# Patient Record
Sex: Female | Born: 1980 | Race: White | Hispanic: No | Marital: Married | Smoking: Never smoker
Health system: Southern US, Community
[De-identification: ages and names within clinical notes are randomized; demographics above are authoritative.]

## PROBLEM LIST (undated history)

## (undated) DIAGNOSIS — R51 Headache: Secondary | ICD-10-CM

## (undated) DIAGNOSIS — R55 Syncope and collapse: Secondary | ICD-10-CM

## (undated) DIAGNOSIS — R519 Headache, unspecified: Secondary | ICD-10-CM

## (undated) DIAGNOSIS — F4310 Post-traumatic stress disorder, unspecified: Secondary | ICD-10-CM

## (undated) DIAGNOSIS — R569 Unspecified convulsions: Secondary | ICD-10-CM

## (undated) DIAGNOSIS — W860XXA Exposure to domestic wiring and appliances, initial encounter: Secondary | ICD-10-CM

## (undated) HISTORY — PX: TUBAL LIGATION: SHX77

## (undated) HISTORY — PX: RIGHT OOPHORECTOMY: SHX2359

## (undated) HISTORY — PX: HAND SURGERY: SHX662

## (undated) HISTORY — PX: TONSILLECTOMY: SUR1361

## (undated) HISTORY — PX: APPENDECTOMY: SHX54

## (undated) HISTORY — PX: TOE SURGERY: SHX1073

## (undated) HISTORY — PX: CHOLECYSTECTOMY: SHX55

## (undated) HISTORY — PX: LAPAROSCOPIC OVARIAN: SHX5906

---

## 2014-07-11 DIAGNOSIS — W860XXA Exposure to domestic wiring and appliances, initial encounter: Secondary | ICD-10-CM

## 2014-07-11 HISTORY — DX: Exposure to domestic wiring and appliances, initial encounter: W86.0XXA

## 2016-07-16 ENCOUNTER — Emergency Department (HOSPITAL_COMMUNITY): Payer: Medicaid Other

## 2016-07-16 ENCOUNTER — Emergency Department (HOSPITAL_COMMUNITY)
Admission: EM | Admit: 2016-07-16 | Discharge: 2016-07-16 | Disposition: A | Discharge status: Home / Self Care | Payer: Medicaid Other | Attending: Emergency Medicine | Admitting: Emergency Medicine

## 2016-07-16 ENCOUNTER — Encounter (HOSPITAL_COMMUNITY): Payer: Self-pay | Admitting: *Deleted

## 2016-07-16 DIAGNOSIS — Y939 Activity, unspecified: Secondary | ICD-10-CM | POA: Diagnosis not present

## 2016-07-16 DIAGNOSIS — Y92512 Supermarket, store or market as the place of occurrence of the external cause: Secondary | ICD-10-CM | POA: Diagnosis not present

## 2016-07-16 DIAGNOSIS — Y999 Unspecified external cause status: Secondary | ICD-10-CM | POA: Insufficient documentation

## 2016-07-16 DIAGNOSIS — R55 Syncope and collapse: Secondary | ICD-10-CM | POA: Diagnosis present

## 2016-07-16 DIAGNOSIS — D649 Anemia, unspecified: Secondary | ICD-10-CM | POA: Diagnosis not present

## 2016-07-16 DIAGNOSIS — W1830XA Fall on same level, unspecified, initial encounter: Secondary | ICD-10-CM | POA: Insufficient documentation

## 2016-07-16 HISTORY — DX: Exposure to domestic wiring and appliances, initial encounter: W86.0XXA

## 2016-07-16 LAB — CBC WITH DIFFERENTIAL/PLATELET
BASOS ABS: 0 10*3/uL (ref 0.0–0.1)
BASOS PCT: 1 %
EOS ABS: 0.2 10*3/uL (ref 0.0–0.7)
Eosinophils Relative: 3 %
HEMATOCRIT: 32.2 % — AB (ref 36.0–46.0)
HEMOGLOBIN: 10.7 g/dL — AB (ref 12.0–15.0)
Lymphocytes Relative: 32 %
Lymphs Abs: 1.9 10*3/uL (ref 0.7–4.0)
MCH: 28.5 pg (ref 26.0–34.0)
MCHC: 33.2 g/dL (ref 30.0–36.0)
MCV: 85.6 fL (ref 78.0–100.0)
Monocytes Absolute: 0.3 10*3/uL (ref 0.1–1.0)
Monocytes Relative: 6 %
NEUTROS ABS: 3.5 10*3/uL (ref 1.7–7.7)
NEUTROS PCT: 58 %
Platelets: 219 10*3/uL (ref 150–400)
RBC: 3.76 MIL/uL — AB (ref 3.87–5.11)
RDW: 13 % (ref 11.5–15.5)
WBC: 5.9 10*3/uL (ref 4.0–10.5)

## 2016-07-16 LAB — I-STAT CHEM 8, ED
BUN: 18 mg/dL (ref 6–20)
Calcium, Ion: 1.21 mmol/L (ref 1.15–1.40)
Chloride: 103 mmol/L (ref 101–111)
Creatinine, Ser: 1.2 mg/dL — ABNORMAL HIGH (ref 0.44–1.00)
Glucose, Bld: 80 mg/dL (ref 65–99)
HEMATOCRIT: 30 % — AB (ref 36.0–46.0)
Hemoglobin: 10.2 g/dL — ABNORMAL LOW (ref 12.0–15.0)
POTASSIUM: 4 mmol/L (ref 3.5–5.1)
SODIUM: 140 mmol/L (ref 135–145)
TCO2: 25 mmol/L (ref 0–100)

## 2016-07-16 LAB — I-STAT BETA HCG BLOOD, ED (MC, WL, AP ONLY): I-stat hCG, quantitative: <5 m[IU]/mL (ref <5)

## 2016-07-16 LAB — CBG MONITORING, ED: GLUCOSE-CAPILLARY: 112 mg/dL — AB (ref 65–99)

## 2016-07-16 MED ORDER — SODIUM CHLORIDE 0.9 % IV SOLN
INTRAVENOUS | Status: DC
  Administered 2016-07-16: 17:00:00 via INTRAVENOUS

## 2016-07-16 MED ORDER — ACETAMINOPHEN 325 MG PO TABS
650.0000 mg | ORAL_TABLET | Freq: Once | ORAL | Status: AC
  Administered 2016-07-16: 650 mg via ORAL
  Filled 2016-07-16: qty 2

## 2016-07-16 MED ORDER — SODIUM CHLORIDE 0.9 % IV BOLUS (SEPSIS)
1000.0000 mL | Freq: Once | INTRAVENOUS | Status: AC
  Administered 2016-07-16: 1000 mL via INTRAVENOUS

## 2016-07-16 MED ORDER — KETOROLAC TROMETHAMINE 30 MG/ML IJ SOLN
15.0000 mg | Freq: Once | INTRAMUSCULAR | Status: AC
  Administered 2016-07-16: 15 mg via INTRAVENOUS
  Filled 2016-07-16: qty 1

## 2016-07-16 MED ORDER — PROCHLORPERAZINE EDISYLATE 5 MG/ML IJ SOLN
10.0000 mg | Freq: Once | INTRAMUSCULAR | Status: AC
  Administered 2016-07-16: 10 mg via INTRAVENOUS
  Filled 2016-07-16: qty 2
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
## 2016-07-16 NOTE — ED Notes (Signed)
Patient transported to CT 

## 2016-07-16 NOTE — Discharge Instructions (Signed)
Follow up with a primary care doctor for further evaluation of the syncopal episodes you have had and your anemia consider holter monitor testing, consider EEG testing with a primary care doctor

## 2016-08-11 ENCOUNTER — Emergency Department (HOSPITAL_BASED_OUTPATIENT_CLINIC_OR_DEPARTMENT_OTHER)
Admission: EM | Admit: 2016-08-11 | Discharge: 2016-08-11 | Disposition: A | Discharge status: Home / Self Care | Payer: Medicaid Other | Attending: Emergency Medicine | Admitting: Emergency Medicine

## 2016-08-11 ENCOUNTER — Encounter (HOSPITAL_BASED_OUTPATIENT_CLINIC_OR_DEPARTMENT_OTHER): Payer: Self-pay

## 2016-08-11 DIAGNOSIS — R072 Precordial pain: Secondary | ICD-10-CM | POA: Insufficient documentation

## 2016-08-11 DIAGNOSIS — R0789 Other chest pain: Secondary | ICD-10-CM

## 2016-08-11 DIAGNOSIS — R55 Syncope and collapse: Secondary | ICD-10-CM | POA: Diagnosis present

## 2016-08-11 MED ORDER — NAPROXEN 375 MG PO TABS: 375.0000 mg | ORAL_TABLET | Freq: Two times a day (BID) | ORAL | 0 refills | Status: AC

## 2016-08-11 MED FILL — NAPROXEN 375 MG TABLET: 375 | 10 days supply | Qty: 20 | Fill #0
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
## 2016-08-13 ENCOUNTER — Other Ambulatory Visit: Payer: Self-pay

## 2016-08-13 ENCOUNTER — Encounter (HOSPITAL_COMMUNITY): Payer: Self-pay | Admitting: Emergency Medicine

## 2016-08-13 ENCOUNTER — Emergency Department (HOSPITAL_COMMUNITY)
Admission: EM | Admit: 2016-08-13 | Discharge: 2016-08-13 | Disposition: A | Discharge status: Home / Self Care | Payer: Medicaid Other | Attending: Emergency Medicine | Admitting: Emergency Medicine

## 2016-08-13 DIAGNOSIS — Y929 Unspecified place or not applicable: Secondary | ICD-10-CM | POA: Insufficient documentation

## 2016-08-13 DIAGNOSIS — Y939 Activity, unspecified: Secondary | ICD-10-CM | POA: Diagnosis not present

## 2016-08-13 DIAGNOSIS — T754XXA Electrocution, initial encounter: Secondary | ICD-10-CM | POA: Diagnosis not present

## 2016-08-13 DIAGNOSIS — S0081XA Abrasion of other part of head, initial encounter: Secondary | ICD-10-CM | POA: Diagnosis not present

## 2016-08-13 DIAGNOSIS — Y999 Unspecified external cause status: Secondary | ICD-10-CM | POA: Insufficient documentation

## 2016-08-13 DIAGNOSIS — W868XXA Exposure to other electric current, initial encounter: Secondary | ICD-10-CM | POA: Diagnosis not present

## 2016-08-13 DIAGNOSIS — Z79899 Other long term (current) drug therapy: Secondary | ICD-10-CM | POA: Insufficient documentation

## 2016-08-13 DIAGNOSIS — R112 Nausea with vomiting, unspecified: Secondary | ICD-10-CM

## 2016-08-13 LAB — BASIC METABOLIC PANEL
Anion gap: 9 (ref 5–15)
BUN: 10 mg/dL (ref 6–20)
CHLORIDE: 103 mmol/L (ref 101–111)
CO2: 25 mmol/L (ref 22–32)
CREATININE: 0.94 mg/dL (ref 0.44–1.00)
Calcium: 9.6 mg/dL (ref 8.9–10.3)
GFR calc non Af Amer: >60 mL/min (ref >60)
Glucose, Bld: 102 mg/dL — ABNORMAL HIGH (ref 65–99)
POTASSIUM: 3.9 mmol/L (ref 3.5–5.1)
Sodium: 137 mmol/L (ref 135–145)

## 2016-08-13 LAB — CK: Total CK: 82 U/L (ref 38–234)

## 2016-08-13 MED ORDER — LORAZEPAM 1 MG PO TABS
1.0000 mg | ORAL_TABLET | Freq: Once | ORAL | Status: DC
  Filled 2016-08-13: qty 1

## 2016-08-13 MED ORDER — METOCLOPRAMIDE HCL 5 MG/ML IJ SOLN
10.0000 mg | Freq: Once | INTRAMUSCULAR | Status: AC
  Administered 2016-08-13: 10 mg via INTRAVENOUS
  Filled 2016-08-13: qty 2

## 2016-08-13 MED ORDER — LORAZEPAM 2 MG/ML IJ SOLN
1.0000 mg | Freq: Once | INTRAMUSCULAR | Status: AC
  Administered 2016-08-13: 1 mg via INTRAVENOUS
  Filled 2016-08-13: qty 1

## 2016-08-13 NOTE — ED Notes (Signed)
Pt vomiting, MD aware. Rn to hold off on given PO Ativan until pt not vomiting.
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
## 2016-08-13 NOTE — ED Notes (Signed)
Pt stating she wants to be transferred to [facility]. Dr Ashok Cordia aware.
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
## 2016-08-13 NOTE — Discharge Instructions (Addendum)
It was our pleasure to provide your ER care today - we hope that you feel better.  Your lab tests look good/normal.   Rest. Drink plenty of fluids.  Take tylenol or motrin as need.   Follow up with primary care doctor Monday.  Return to ER if worse, new symptoms, persistent vomiting, other concern.   You were given medication in the ER that causes drowsiness - no driving for the next 6 hours.

## 2016-10-04 ENCOUNTER — Emergency Department (HOSPITAL_BASED_OUTPATIENT_CLINIC_OR_DEPARTMENT_OTHER): Payer: Commercial Managed Care - PPO

## 2016-10-04 ENCOUNTER — Encounter (HOSPITAL_BASED_OUTPATIENT_CLINIC_OR_DEPARTMENT_OTHER): Payer: Self-pay

## 2016-10-04 ENCOUNTER — Emergency Department (HOSPITAL_BASED_OUTPATIENT_CLINIC_OR_DEPARTMENT_OTHER)
Admission: EM | Admit: 2016-10-04 | Discharge: 2016-10-04 | Disposition: A | Discharge status: Home / Self Care | Payer: Commercial Managed Care - PPO | Attending: Physician Assistant | Admitting: Physician Assistant

## 2016-10-04 DIAGNOSIS — R112 Nausea with vomiting, unspecified: Secondary | ICD-10-CM | POA: Insufficient documentation

## 2016-10-04 DIAGNOSIS — R109 Unspecified abdominal pain: Secondary | ICD-10-CM | POA: Diagnosis not present

## 2016-10-04 DIAGNOSIS — G4700 Insomnia, unspecified: Secondary | ICD-10-CM

## 2016-10-04 DIAGNOSIS — Z79899 Other long term (current) drug therapy: Secondary | ICD-10-CM | POA: Diagnosis not present

## 2016-10-04 DIAGNOSIS — R51 Headache: Secondary | ICD-10-CM | POA: Diagnosis present

## 2016-10-04 HISTORY — DX: Syncope and collapse: R55

## 2016-10-04 HISTORY — DX: Unspecified convulsions: R56.9

## 2016-10-04 HISTORY — DX: Post-traumatic stress disorder, unspecified: F43.10

## 2016-10-04 HISTORY — DX: Headache: R51

## 2016-10-04 HISTORY — DX: Headache, unspecified: R51.9

## 2016-10-04 LAB — COMPREHENSIVE METABOLIC PANEL
ALK PHOS: 70 U/L (ref 38–126)
ALT: 14 U/L (ref 14–54)
ANION GAP: 8 (ref 5–15)
AST: 17 U/L (ref 15–41)
Albumin: 4.2 g/dL (ref 3.5–5.0)
BUN: 12 mg/dL (ref 6–20)
CALCIUM: 9.3 mg/dL (ref 8.9–10.3)
CO2: 24 mmol/L (ref 22–32)
Chloride: 108 mmol/L (ref 101–111)
Creatinine, Ser: 1 mg/dL (ref 0.44–1.00)
GFR calc non Af Amer: >60 mL/min (ref >60)
Glucose, Bld: 99 mg/dL (ref 65–99)
Potassium: 4 mmol/L (ref 3.5–5.1)
SODIUM: 140 mmol/L (ref 135–145)
TOTAL PROTEIN: 7.7 g/dL (ref 6.5–8.1)
Total Bilirubin: 0.5 mg/dL (ref 0.3–1.2)

## 2016-10-04 LAB — URINALYSIS, ROUTINE W REFLEX MICROSCOPIC
BILIRUBIN URINE: NEGATIVE
GLUCOSE, UA: NEGATIVE mg/dL
HGB URINE DIPSTICK: NEGATIVE
Ketones, ur: NEGATIVE mg/dL
Leukocytes, UA: NEGATIVE
Nitrite: NEGATIVE
PH: 5.5 (ref 5.0–8.0)
Protein, ur: NEGATIVE mg/dL
SPECIFIC GRAVITY, URINE: 1.024 (ref 1.005–1.030)

## 2016-10-04 LAB — ETHANOL: Alcohol, Ethyl (B): <5 mg/dL (ref <5)

## 2016-10-04 LAB — CBC WITH DIFFERENTIAL/PLATELET
Basophils Absolute: 0 10*3/uL (ref 0.0–0.1)
Basophils Relative: 0 %
EOS ABS: 0.1 10*3/uL (ref 0.0–0.7)
EOS PCT: 1 %
HCT: 36 % (ref 36.0–46.0)
HEMOGLOBIN: 12.1 g/dL (ref 12.0–15.0)
LYMPHS ABS: 1.9 10*3/uL (ref 0.7–4.0)
Lymphocytes Relative: 26 %
MCH: 27.6 pg (ref 26.0–34.0)
MCHC: 33.6 g/dL (ref 30.0–36.0)
MCV: 82.2 fL (ref 78.0–100.0)
MONO ABS: 0.5 10*3/uL (ref 0.1–1.0)
Monocytes Relative: 7 %
NEUTROS PCT: 66 %
Neutro Abs: 4.9 10*3/uL (ref 1.7–7.7)
Platelets: 258 10*3/uL (ref 150–400)
RBC: 4.38 MIL/uL (ref 3.87–5.11)
RDW: 13.8 % (ref 11.5–15.5)
WBC: 7.3 10*3/uL (ref 4.0–10.5)

## 2016-10-04 LAB — ACETAMINOPHEN LEVEL: Acetaminophen (Tylenol), Serum: <10 ug/mL — ABNORMAL LOW (ref 10–30)

## 2016-10-04 LAB — RAPID URINE DRUG SCREEN, HOSP PERFORMED
Amphetamines: NOT DETECTED
Barbiturates: POSITIVE — AB
Benzodiazepines: NOT DETECTED
COCAINE: NOT DETECTED
OPIATES: NOT DETECTED
TETRAHYDROCANNABINOL: NOT DETECTED

## 2016-10-04 LAB — SALICYLATE LEVEL: Salicylate Lvl: <7 mg/dL (ref 2.8–30.0)

## 2016-10-04 LAB — PREGNANCY, URINE: Preg Test, Ur: NEGATIVE

## 2016-10-04 MED ORDER — DIPHENHYDRAMINE HCL 50 MG/ML IJ SOLN
25.0000 mg | Freq: Once | INTRAMUSCULAR | Status: AC
  Administered 2016-10-04: 25 mg via INTRAVENOUS
  Filled 2016-10-04: qty 1

## 2016-10-04 MED ORDER — METOCLOPRAMIDE HCL 5 MG/ML IJ SOLN
10.0000 mg | Freq: Once | INTRAMUSCULAR | Status: AC
  Administered 2016-10-04: 10 mg via INTRAVENOUS
  Filled 2016-10-04: qty 2

## 2016-10-04 MED ORDER — KETOROLAC TROMETHAMINE 15 MG/ML IJ SOLN
30.0000 mg | Freq: Once | INTRAMUSCULAR | Status: AC
  Administered 2016-10-04: 30 mg via INTRAVENOUS
  Filled 2016-10-04: qty 2

## 2016-10-04 MED ORDER — SODIUM CHLORIDE 0.9 % IV BOLUS (SEPSIS)
1000.0000 mL | Freq: Once | INTRAVENOUS | Status: AC
  Administered 2016-10-04: 1000 mL via INTRAVENOUS
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
## 2016-11-02 ENCOUNTER — Encounter (HOSPITAL_BASED_OUTPATIENT_CLINIC_OR_DEPARTMENT_OTHER): Payer: Self-pay | Admitting: Emergency Medicine

## 2016-11-02 ENCOUNTER — Emergency Department (HOSPITAL_BASED_OUTPATIENT_CLINIC_OR_DEPARTMENT_OTHER)
Admission: EM | Admit: 2016-11-02 | Discharge: 2016-11-02 | Disposition: A | Discharge status: Home / Self Care | Payer: Commercial Managed Care - PPO | Attending: Emergency Medicine | Admitting: Emergency Medicine

## 2016-11-02 ENCOUNTER — Emergency Department (HOSPITAL_BASED_OUTPATIENT_CLINIC_OR_DEPARTMENT_OTHER): Payer: Commercial Managed Care - PPO

## 2016-11-02 DIAGNOSIS — O209 Hemorrhage in early pregnancy, unspecified: Secondary | ICD-10-CM

## 2016-11-02 DIAGNOSIS — D259 Leiomyoma of uterus, unspecified: Secondary | ICD-10-CM

## 2016-11-02 DIAGNOSIS — N939 Abnormal uterine and vaginal bleeding, unspecified: Secondary | ICD-10-CM

## 2016-11-02 DIAGNOSIS — Z79899 Other long term (current) drug therapy: Secondary | ICD-10-CM | POA: Insufficient documentation

## 2016-11-02 DIAGNOSIS — Z3A10 10 weeks gestation of pregnancy: Secondary | ICD-10-CM | POA: Diagnosis not present

## 2016-11-02 DIAGNOSIS — O3411 Maternal care for benign tumor of corpus uteri, first trimester: Secondary | ICD-10-CM | POA: Insufficient documentation

## 2016-11-02 DIAGNOSIS — N9489 Other specified conditions associated with female genital organs and menstrual cycle: Secondary | ICD-10-CM | POA: Diagnosis not present

## 2016-11-02 LAB — CBC WITH DIFFERENTIAL/PLATELET
BASOS ABS: 0 10*3/uL (ref 0.0–0.1)
Basophils Relative: 1 %
Eosinophils Absolute: 0.1 10*3/uL (ref 0.0–0.7)
Eosinophils Relative: 1 %
HEMATOCRIT: 32.3 % — AB (ref 36.0–46.0)
Hemoglobin: 10.6 g/dL — ABNORMAL LOW (ref 12.0–15.0)
LYMPHS PCT: 26 %
Lymphs Abs: 1.1 10*3/uL (ref 0.7–4.0)
MCH: 27.8 pg (ref 26.0–34.0)
MCHC: 32.8 g/dL (ref 30.0–36.0)
MCV: 84.8 fL (ref 78.0–100.0)
MONO ABS: 0.2 10*3/uL (ref 0.1–1.0)
MONOS PCT: 5 %
NEUTROS ABS: 2.8 10*3/uL (ref 1.7–7.7)
Neutrophils Relative %: 67 %
Platelets: 187 10*3/uL (ref 150–400)
RBC: 3.81 MIL/uL — ABNORMAL LOW (ref 3.87–5.11)
RDW: 13.5 % (ref 11.5–15.5)
WBC: 4.2 10*3/uL (ref 4.0–10.5)

## 2016-11-02 LAB — WET PREP, GENITAL
Sperm: NONE SEEN
Trich, Wet Prep: NONE SEEN
Yeast Wet Prep HPF POC: NONE SEEN

## 2016-11-02 LAB — URINALYSIS, MICROSCOPIC (REFLEX)

## 2016-11-02 LAB — URINALYSIS, ROUTINE W REFLEX MICROSCOPIC
BILIRUBIN URINE: NEGATIVE
GLUCOSE, UA: NEGATIVE mg/dL
KETONES UR: NEGATIVE mg/dL
Leukocytes, UA: NEGATIVE
Nitrite: NEGATIVE
PH: 8.5 — AB (ref 5.0–8.0)
Protein, ur: NEGATIVE mg/dL
Specific Gravity, Urine: 1.018 (ref 1.005–1.030)

## 2016-11-02 LAB — PREGNANCY, URINE: Preg Test, Ur: POSITIVE — AB

## 2016-11-02 LAB — HCG, QUANTITATIVE, PREGNANCY: hCG, Beta Chain, Quant, S: <1 m[IU]/mL (ref <5)

## 2016-11-02 LAB — ABO/RH: ABO/RH(D): O POS

## 2016-11-02 MED ORDER — ACETAMINOPHEN 325 MG PO TABS
650.0000 mg | ORAL_TABLET | Freq: Once | ORAL | Status: AC
  Administered 2016-11-02: 650 mg via ORAL
  Filled 2016-11-02: qty 2

## 2016-11-02 NOTE — ED Triage Notes (Signed)
Vaginal bleeding started today, took home preg test several months ago and came back positive

## 2016-11-02 NOTE — ED Notes (Signed)
ED Provider at bedside. 

## 2016-11-02 NOTE — Discharge Instructions (Signed)
Your blood work and ultrasound confirmed no active pregnancy. It is possible that your urine tests were a lab error. Please follow up with your OB/GYN if continue to have bleeding or any other concerns
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
## 2016-11-03 LAB — GC/CHLAMYDIA PROBE AMP (~~LOC~~) NOT AT ARMC
Chlamydia: NEGATIVE
NEISSERIA GONORRHEA: NEGATIVE

## 2016-12-03 ENCOUNTER — Ambulatory Visit: Payer: Self-pay | Admitting: Physician Assistant

## 2017-02-11 ENCOUNTER — Encounter (HOSPITAL_COMMUNITY): Payer: Self-pay

## 2017-02-11 ENCOUNTER — Emergency Department (HOSPITAL_COMMUNITY): Payer: Commercial Managed Care - PPO

## 2017-02-11 ENCOUNTER — Emergency Department (HOSPITAL_COMMUNITY)
Admission: EM | Admit: 2017-02-11 | Discharge: 2017-02-12 | Disposition: A | Discharge status: Home / Self Care | Payer: Commercial Managed Care - PPO | Attending: Emergency Medicine | Admitting: Emergency Medicine

## 2017-02-11 DIAGNOSIS — R569 Unspecified convulsions: Secondary | ICD-10-CM

## 2017-02-11 DIAGNOSIS — N760 Acute vaginitis: Secondary | ICD-10-CM | POA: Insufficient documentation

## 2017-02-11 DIAGNOSIS — Z79899 Other long term (current) drug therapy: Secondary | ICD-10-CM | POA: Insufficient documentation

## 2017-02-11 DIAGNOSIS — B9689 Other specified bacterial agents as the cause of diseases classified elsewhere: Secondary | ICD-10-CM

## 2017-02-11 LAB — HEPATIC FUNCTION PANEL
ALBUMIN: 3.7 g/dL (ref 3.5–5.0)
ALT: 15 U/L (ref 14–54)
AST: 17 U/L (ref 15–41)
Alkaline Phosphatase: 66 U/L (ref 38–126)
BILIRUBIN TOTAL: 0.3 mg/dL (ref 0.3–1.2)
Bilirubin, Direct: <0.1 mg/dL — ABNORMAL LOW (ref 0.1–0.5)
TOTAL PROTEIN: 6.5 g/dL (ref 6.5–8.1)

## 2017-02-11 LAB — CBG MONITORING, ED: GLUCOSE-CAPILLARY: 101 mg/dL — AB (ref 65–99)

## 2017-02-11 LAB — BASIC METABOLIC PANEL
ANION GAP: 7 (ref 5–15)
BUN: 13 mg/dL (ref 6–20)
CALCIUM: 8.5 mg/dL — AB (ref 8.9–10.3)
CO2: 21 mmol/L — ABNORMAL LOW (ref 22–32)
CREATININE: 0.91 mg/dL (ref 0.44–1.00)
Chloride: 111 mmol/L (ref 101–111)
GFR calc Af Amer: >60 mL/min (ref >60)
GLUCOSE: 98 mg/dL (ref 65–99)
Potassium: 3.3 mmol/L — ABNORMAL LOW (ref 3.5–5.1)
Sodium: 139 mmol/L (ref 135–145)

## 2017-02-11 LAB — CBC
HCT: 29.2 % — ABNORMAL LOW (ref 36.0–46.0)
Hemoglobin: 9.8 g/dL — ABNORMAL LOW (ref 12.0–15.0)
MCH: 25.7 pg — AB (ref 26.0–34.0)
MCHC: 33.6 g/dL (ref 30.0–36.0)
MCV: 76.4 fL — AB (ref 78.0–100.0)
PLATELETS: 253 10*3/uL (ref 150–400)
RBC: 3.82 MIL/uL — ABNORMAL LOW (ref 3.87–5.11)
RDW: 16.6 % — AB (ref 11.5–15.5)
WBC: 5.5 10*3/uL (ref 4.0–10.5)

## 2017-02-11 LAB — PROTIME-INR
INR: 1.02
Prothrombin Time: 13.4 seconds (ref 11.4–15.2)

## 2017-02-11 LAB — I-STAT BETA HCG BLOOD, ED (MC, WL, AP ONLY): I-stat hCG, quantitative: <5 m[IU]/mL (ref <5)

## 2017-02-11 LAB — I-STAT TROPONIN, ED: TROPONIN I, POC: 0 ng/mL (ref 0.00–0.08)

## 2017-02-11 LAB — WET PREP, GENITAL
SPERM: NONE SEEN
Trich, Wet Prep: NONE SEEN
Yeast Wet Prep HPF POC: NONE SEEN

## 2017-02-11 MED ORDER — DIPHENHYDRAMINE HCL 50 MG/ML IJ SOLN
25.0000 mg | Freq: Once | INTRAMUSCULAR | Status: AC
  Administered 2017-02-11: 20:00:00 via INTRAVENOUS
  Filled 2017-02-11: qty 1

## 2017-02-11 MED ORDER — SODIUM CHLORIDE 0.9 % IV BOLUS (SEPSIS)
1000.0000 mL | Freq: Once | INTRAVENOUS | Status: AC
  Administered 2017-02-11: 1000 mL via INTRAVENOUS

## 2017-02-11 MED ORDER — PROCHLORPERAZINE EDISYLATE 5 MG/ML IJ SOLN
10.0000 mg | Freq: Once | INTRAMUSCULAR | Status: AC
  Administered 2017-02-11: 10 mg via INTRAVENOUS
  Filled 2017-02-11: qty 2

## 2017-02-11 NOTE — ED Notes (Signed)
No respiratory or acute distress noted alert and oriented x 3 seizure precautions in effect c/o headache voiced no seizure activity noted.

## 2017-02-11 NOTE — ED Notes (Signed)
No respiratory or acute distress noted no seizure activity noted seizure precautions in effect no reaction to medication noted no headache or pain voiced.

## 2017-02-11 NOTE — ED Triage Notes (Signed)
She states she has had "about 6 seizures today". She states she has hx of seizure and has recently ceased taking Klonipin. She is alert and oriented x 4 with clear speech. She states she fell in our female bathroom in our lobby before triage. Our doctor, Dr. Sherry Ruffing examined her at the time of the fall.
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
## 2017-02-11 NOTE — ED Notes (Signed)
EKG given to EDP,Tegeler,MD., for review. 

## 2017-02-11 NOTE — ED Notes (Signed)
No reaction to medication noted pt resting with eyes closed visitor at bedside call light in reach.

## 2017-02-11 NOTE — ED Notes (Signed)
Bed: RESA Expected date:  Expected time:  Means of arrival:  Comments: 

## 2017-02-12 MED ORDER — CLINDAMYCIN HCL 300 MG PO CAPS
300.0000 mg | ORAL_CAPSULE | Freq: Once | ORAL | Status: AC
  Administered 2017-02-12: 300 mg via ORAL
  Filled 2017-02-12: qty 1

## 2017-02-12 MED ORDER — METRONIDAZOLE 500 MG PO TABS: 500.0000 mg | ORAL_TABLET | Freq: Two times a day (BID) | ORAL | 0 refills | Status: DC

## 2017-02-12 MED ORDER — METRONIDAZOLE 500 MG PO TABS: 500.0000 mg | ORAL_TABLET | Freq: Once | ORAL | Status: DC

## 2017-02-12 NOTE — Discharge Instructions (Signed)
Please take your antibiotics to treat your bacterial vaginosis. Please stay hydrated. Please try and get some sleep and avoid caffeinated products. Please call to follow-up with your primary neurologist for further workup and management. If you have more seizures or any new or worsened symptoms, please return to the nearest emergency department.

## 2017-02-12 NOTE — ED Notes (Signed)
No respiratory or acute distress noted alert and oriented x 3 no seizure activity noted seizure precautions in effect visitor at bedside call light in reach no reaction to medication noted.

## 2017-02-13 LAB — GC/CHLAMYDIA PROBE AMP (~~LOC~~) NOT AT ARMC
CHLAMYDIA, DNA PROBE: POSITIVE — AB
NEISSERIA GONORRHEA: NEGATIVE

## 2017-02-13 LAB — TOPIRAMATE LEVEL: Topiramate Lvl: NOT DETECTED ug/mL (ref 2.0–25.0)

## 2017-02-14 ENCOUNTER — Telehealth: Payer: Self-pay | Admitting: Student

## 2017-02-14 DIAGNOSIS — A749 Chlamydial infection, unspecified: Secondary | ICD-10-CM

## 2017-02-14 MED ORDER — AZITHROMYCIN 500 MG PO TABS: 1000.0000 mg | ORAL_TABLET | Freq: Once | ORAL | 0 refills | Status: AC

## 2017-02-14 NOTE — Telephone Encounter (Addendum)
-----   Message from Bjorn Loser, RN sent at 02/14/2017  3:58 PM EDT ----- This patient tested positive for chlamydia  She is allergic to "Penicillin, Levaquin and Zofran,"I have informed the patient of her results and confirmed her pharmacy is correct in her chart. Please send Rx.   Thank you,   Bjorn Loser, RN   Results faxed to [facility] Department.      Spero Geralds tested positive for  Chlamydia. Patient was called by RN and allergies and pharmacy confirmed. Rx sent to pharmacy of choice.   Jorje Guild, NP 02/14/2017 4:14 PM

## 2017-03-08 ENCOUNTER — Emergency Department (HOSPITAL_COMMUNITY)
Admission: EM | Admit: 2017-03-08 | Discharge: 2017-03-08 | Disposition: A | Discharge status: Home / Self Care | Payer: Commercial Managed Care - PPO | Attending: Emergency Medicine | Admitting: Emergency Medicine

## 2017-03-08 ENCOUNTER — Encounter (HOSPITAL_COMMUNITY): Payer: Self-pay | Admitting: Emergency Medicine

## 2017-03-08 DIAGNOSIS — R569 Unspecified convulsions: Secondary | ICD-10-CM | POA: Insufficient documentation

## 2017-03-08 DIAGNOSIS — Z79899 Other long term (current) drug therapy: Secondary | ICD-10-CM | POA: Insufficient documentation

## 2017-03-08 LAB — URINALYSIS, ROUTINE W REFLEX MICROSCOPIC
BILIRUBIN URINE: NEGATIVE
GLUCOSE, UA: NEGATIVE mg/dL
HGB URINE DIPSTICK: NEGATIVE
KETONES UR: NEGATIVE mg/dL
Leukocytes, UA: NEGATIVE
Nitrite: NEGATIVE
PROTEIN: NEGATIVE mg/dL
Specific Gravity, Urine: 1.012 (ref 1.005–1.030)
pH: 6 (ref 5.0–8.0)

## 2017-03-08 LAB — COMPREHENSIVE METABOLIC PANEL
ALK PHOS: 57 U/L (ref 38–126)
ALT: 12 U/L — AB (ref 14–54)
AST: 17 U/L (ref 15–41)
Albumin: 4.2 g/dL (ref 3.5–5.0)
Anion gap: 8 (ref 5–15)
BUN: 12 mg/dL (ref 6–20)
CALCIUM: 9.1 mg/dL (ref 8.9–10.3)
CHLORIDE: 108 mmol/L (ref 101–111)
CO2: 22 mmol/L (ref 22–32)
CREATININE: 0.78 mg/dL (ref 0.44–1.00)
Glucose, Bld: 99 mg/dL (ref 65–99)
Potassium: 3.7 mmol/L (ref 3.5–5.1)
Sodium: 138 mmol/L (ref 135–145)
Total Bilirubin: 0.5 mg/dL (ref 0.3–1.2)
Total Protein: 7.1 g/dL (ref 6.5–8.1)

## 2017-03-08 LAB — RAPID URINE DRUG SCREEN, HOSP PERFORMED
Amphetamines: NOT DETECTED
BARBITURATES: NOT DETECTED
Benzodiazepines: NOT DETECTED
Cocaine: NOT DETECTED
Opiates: NOT DETECTED
TETRAHYDROCANNABINOL: NOT DETECTED

## 2017-03-08 LAB — CBC
HCT: 34.5 % — ABNORMAL LOW (ref 36.0–46.0)
Hemoglobin: 11.3 g/dL — ABNORMAL LOW (ref 12.0–15.0)
MCH: 26.2 pg (ref 26.0–34.0)
MCHC: 32.8 g/dL (ref 30.0–36.0)
MCV: 79.9 fL (ref 78.0–100.0)
PLATELETS: 226 10*3/uL (ref 150–400)
RBC: 4.32 MIL/uL (ref 3.87–5.11)
RDW: 16.3 % — AB (ref 11.5–15.5)
WBC: 6.4 10*3/uL (ref 4.0–10.5)

## 2017-03-08 LAB — I-STAT BETA HCG BLOOD, ED (MC, WL, AP ONLY)

## 2017-03-08 LAB — LIPASE, BLOOD: LIPASE: 24 U/L (ref 11–51)

## 2017-03-08 MED ORDER — SODIUM CHLORIDE 0.9 % IV BOLUS (SEPSIS)
1000.0000 mL | Freq: Once | INTRAVENOUS | Status: AC
  Administered 2017-03-08: 1000 mL via INTRAVENOUS
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
## 2017-03-08 NOTE — ED Notes (Signed)
Pt is alert  And oriented x 4 and is verbally responsive. Pt states that she has been having RUQ pain dull 7/10 accompanied w/ Nausea and reports 3 episodes of emesis.

## 2017-03-08 NOTE — Discharge Instructions (Signed)
Continue all current medications. Please call your neurologist tomorrow morning, follow-up as instructed. Return if worsening.

## 2017-03-08 NOTE — ED Notes (Signed)
Pt sta

## 2017-03-08 NOTE — ED Notes (Signed)
EKG given to EDP,Yelverton,MD., for review. 

## 2017-03-08 NOTE — ED Triage Notes (Signed)
Pt states that she has had about 5 seizures today and now has RUQ pain. S/p cholecystectomy. Alert and oriented.

## 2017-03-08 NOTE — ED Notes (Signed)
Pt ambulatory and independent at discharge.  Verbalized understanding of discharge instructions 
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
## 2019-02-18 IMAGING — CR DG CHEST 2V
2 series · 2 of 2 positions shown · non-contrast
Comparison: None.

CLINICAL DATA: Multiple seizures today. Recently stopped taking
colonic 10.

EXAM:
CHEST  2 VIEW

[w chest pa]
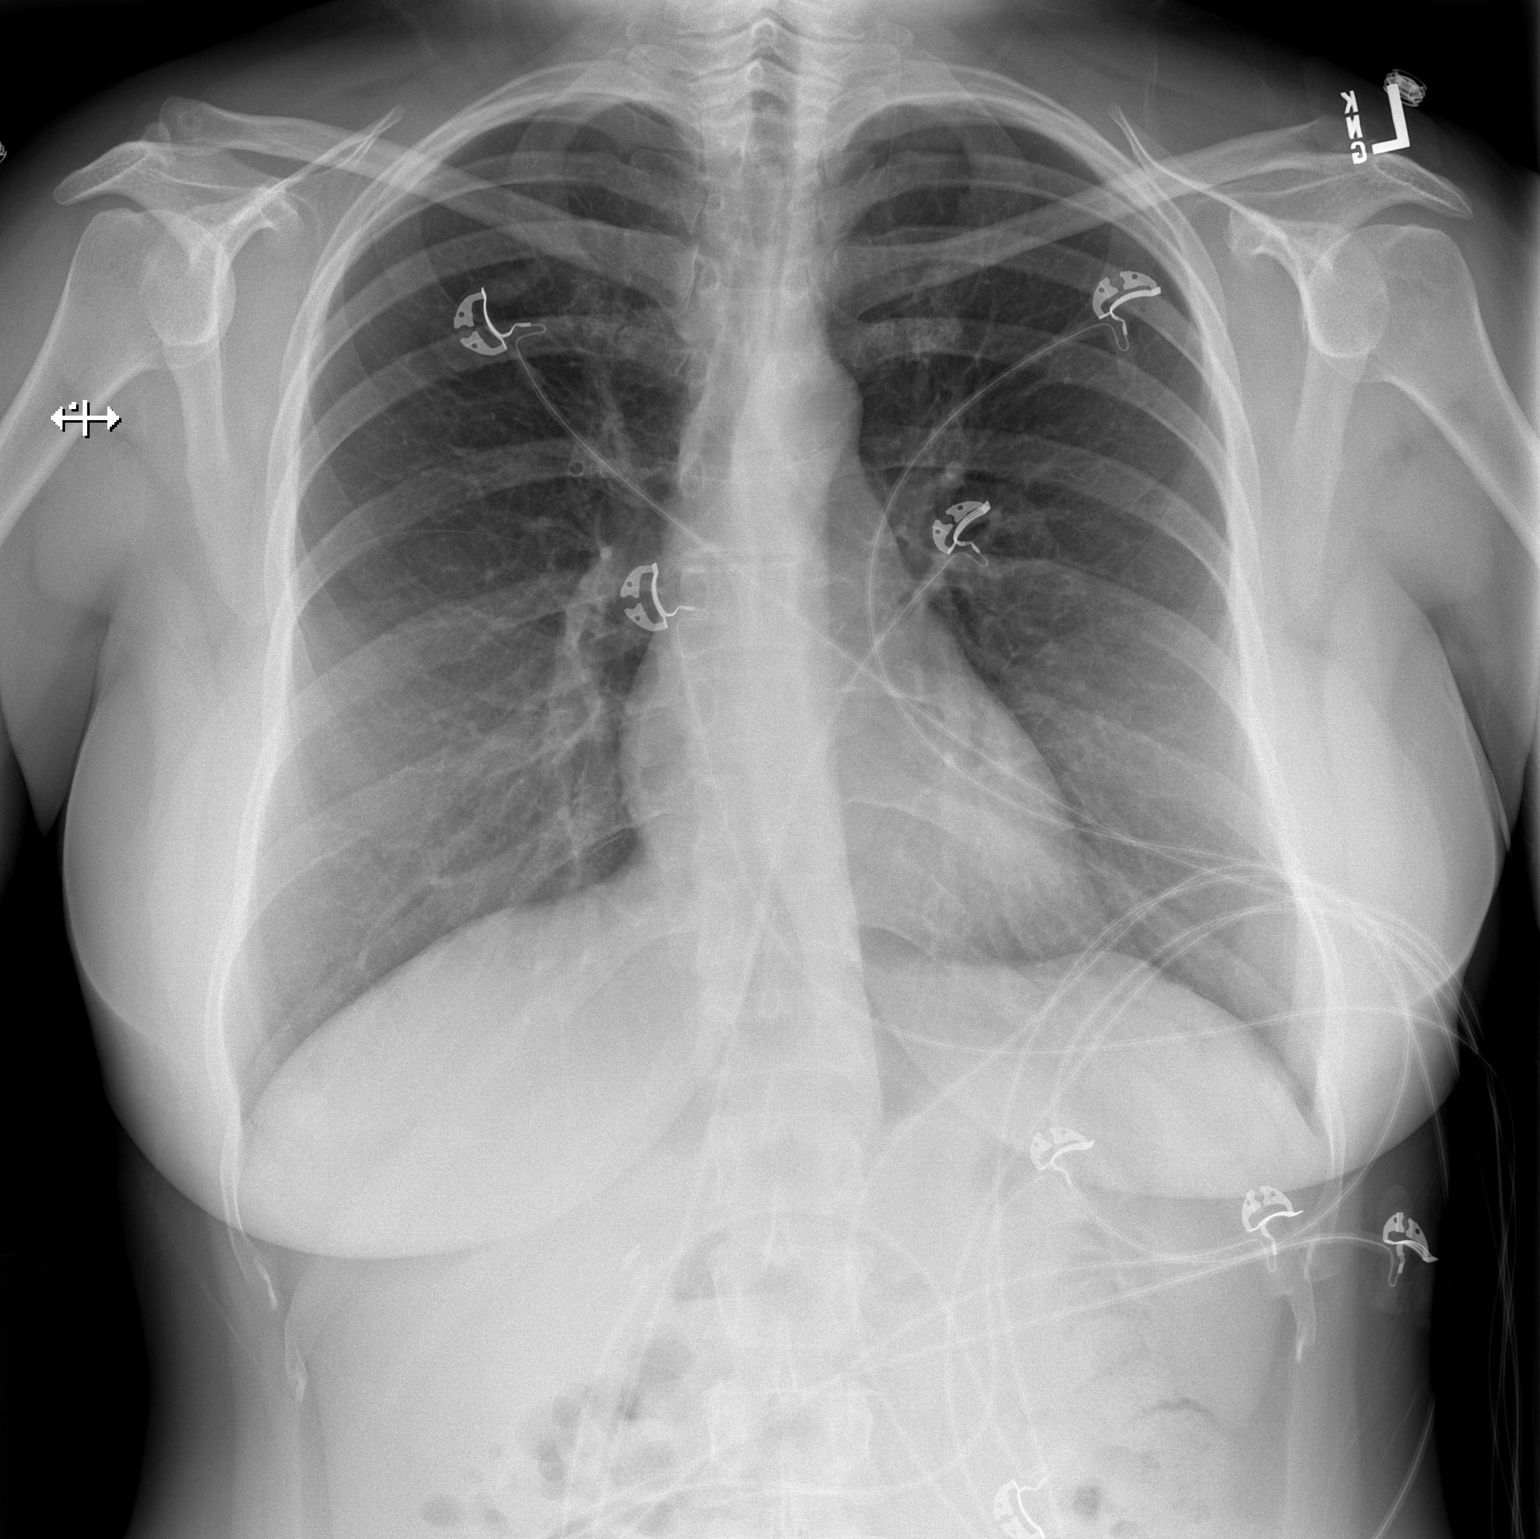

[w chest lat]
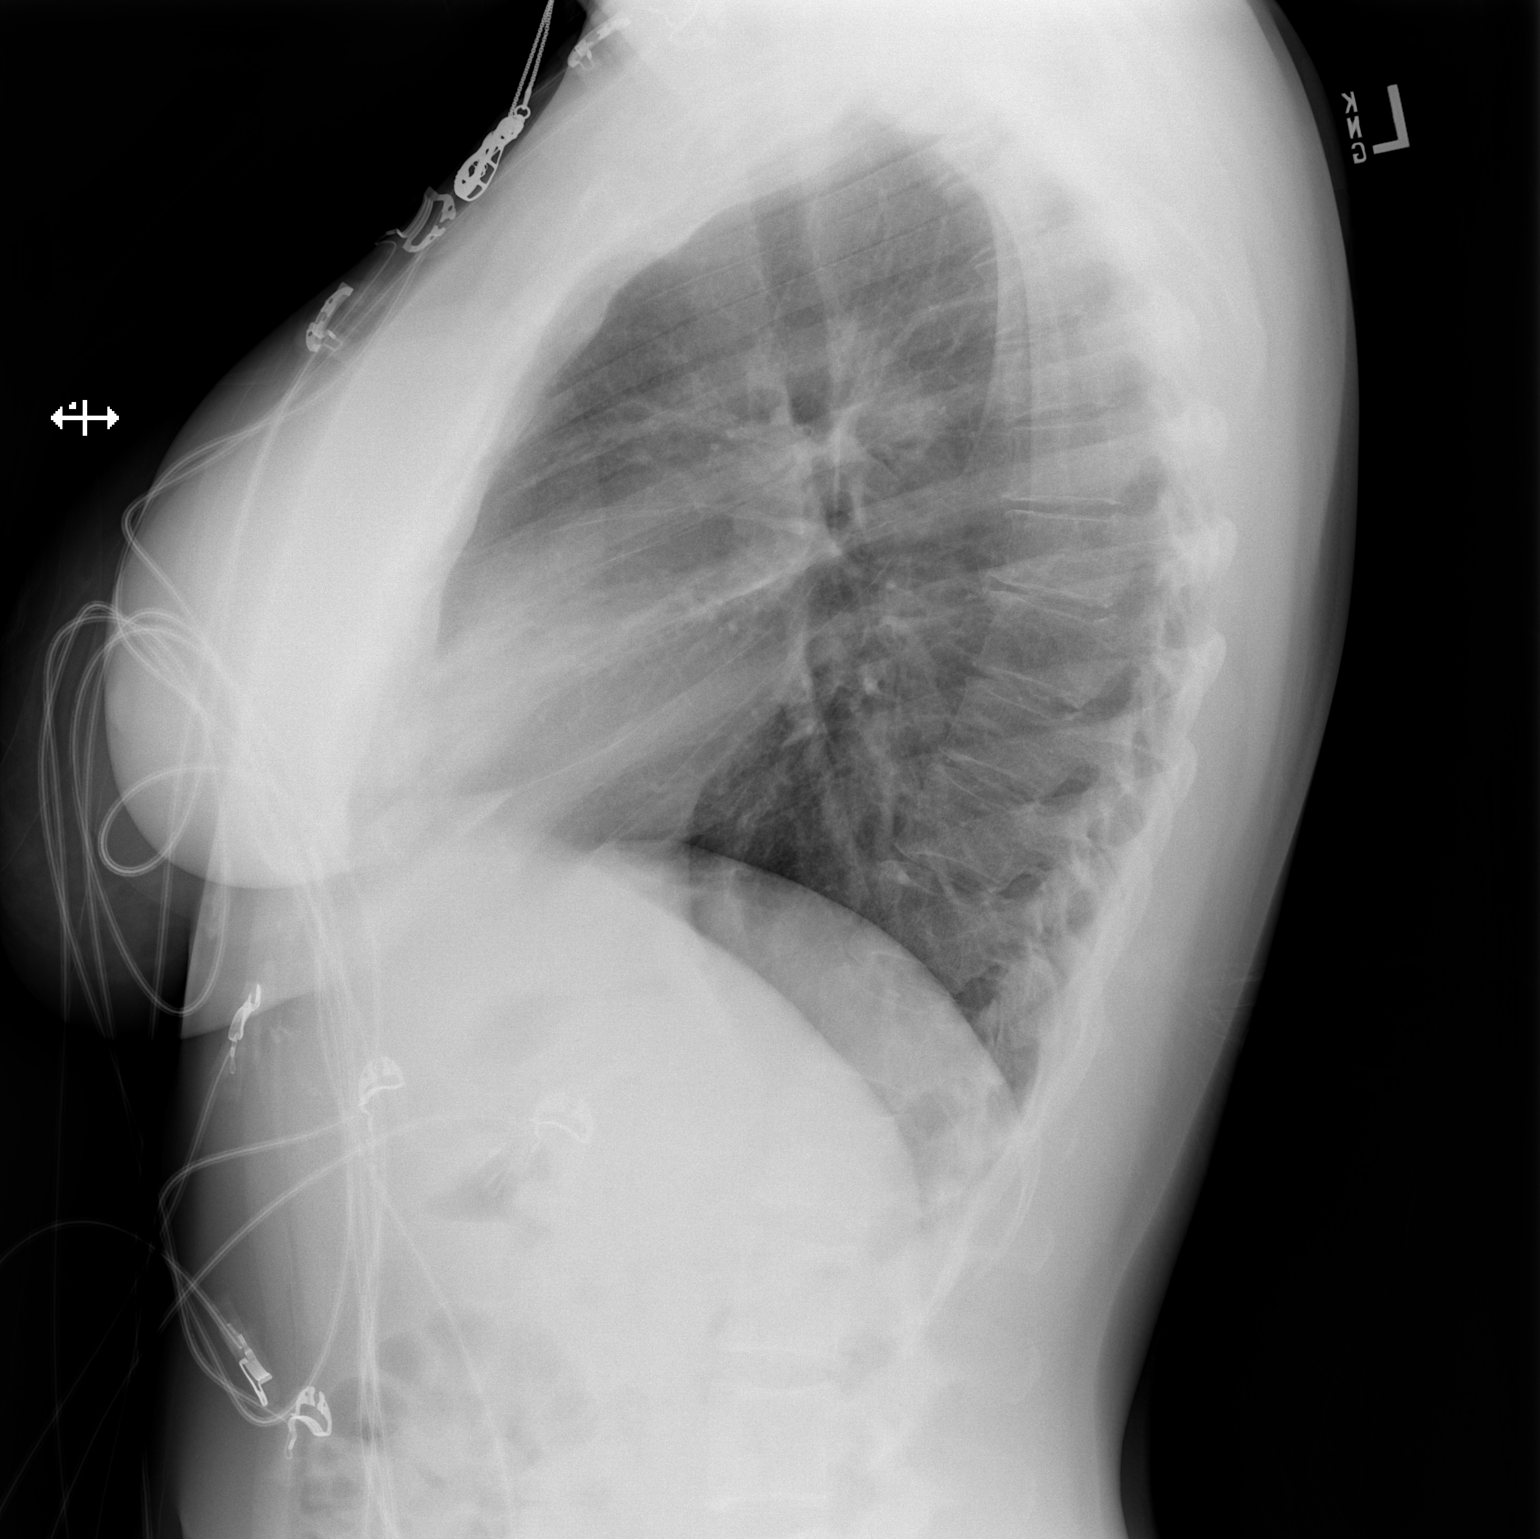

[2 of 2 positions shown; findings below may reference images not displayed]

FINDINGS: Cardiomediastinal silhouette is normal. No pleural effusions or
focal consolidations. Trachea projects midline and there is no
pneumothorax. Soft tissue planes and included osseous structures are
non-suspicious. Mild thoracic dextroscoliosis could be positional.
Surgical clips in the included right abdomen compatible with
cholecystectomy. Bilateral breast implants.
IMPRESSION: Normal chest.
